# Patient Record
Sex: Male | Born: 1940 | Race: White | Hispanic: No | Marital: Married | Smoking: Never smoker
Health system: Southern US, Community
[De-identification: ages and names within clinical notes are randomized; demographics above are authoritative.]

## PROBLEM LIST (undated history)

## (undated) DIAGNOSIS — I4891 Unspecified atrial fibrillation: Secondary | ICD-10-CM

## (undated) DIAGNOSIS — R413 Other amnesia: Secondary | ICD-10-CM

## (undated) DIAGNOSIS — E7800 Pure hypercholesterolemia, unspecified: Secondary | ICD-10-CM

## (undated) DIAGNOSIS — I10 Essential (primary) hypertension: Secondary | ICD-10-CM

## (undated) HISTORY — PX: HERNIA REPAIR: SHX51

## (undated) HISTORY — PX: JOINT REPLACEMENT: SHX530

---

## 1999-09-17 ENCOUNTER — Ambulatory Visit (HOSPITAL_COMMUNITY): Admission: RE | Admit: 1999-09-17 | Discharge: 1999-09-17 | Payer: Self-pay | Admitting: Cardiology

## 1999-10-15 ENCOUNTER — Ambulatory Visit (HOSPITAL_COMMUNITY): Admission: RE | Admit: 1999-10-15 | Discharge: 1999-10-15 | Payer: Self-pay | Admitting: Cardiology

## 2000-10-28 ENCOUNTER — Ambulatory Visit (HOSPITAL_COMMUNITY): Admission: RE | Admit: 2000-10-28 | Discharge: 2000-10-28 | Payer: Self-pay | Admitting: Cardiology

## 2001-01-25 ENCOUNTER — Encounter: Admission: RE | Admit: 2001-01-25 | Discharge: 2001-01-25 | Payer: Self-pay

## 2001-02-03 ENCOUNTER — Ambulatory Visit (HOSPITAL_COMMUNITY): Admission: RE | Admit: 2001-02-03 | Discharge: 2001-02-03 | Payer: Self-pay | Admitting: Cardiology

## 2001-03-28 ENCOUNTER — Encounter: Admission: RE | Admit: 2001-03-28 | Discharge: 2001-03-28 | Payer: Self-pay

## 2001-05-20 ENCOUNTER — Ambulatory Visit (HOSPITAL_COMMUNITY): Admission: RE | Admit: 2001-05-20 | Discharge: 2001-05-20 | Payer: Self-pay | Admitting: Cardiology

## 2001-08-26 ENCOUNTER — Ambulatory Visit (HOSPITAL_COMMUNITY): Admission: RE | Admit: 2001-08-26 | Discharge: 2001-08-26 | Payer: Self-pay | Admitting: Cardiology

## 2002-04-07 ENCOUNTER — Encounter (INDEPENDENT_AMBULATORY_CARE_PROVIDER_SITE_OTHER): Payer: Self-pay | Admitting: Specialist

## 2002-04-07 ENCOUNTER — Ambulatory Visit (HOSPITAL_COMMUNITY): Admission: RE | Admit: 2002-04-07 | Discharge: 2002-04-07 | Payer: Self-pay | Admitting: Gastroenterology

## 2002-10-13 ENCOUNTER — Encounter: Payer: Self-pay | Admitting: Cardiology

## 2002-10-13 ENCOUNTER — Ambulatory Visit (HOSPITAL_COMMUNITY): Admission: RE | Admit: 2002-10-13 | Discharge: 2002-10-13 | Payer: Self-pay | Admitting: Cardiology

## 2005-05-20 ENCOUNTER — Emergency Department (HOSPITAL_COMMUNITY): Admission: EM | Admit: 2005-05-20 | Discharge: 2005-05-20 | Payer: Self-pay | Admitting: Emergency Medicine

## 2005-06-09 ENCOUNTER — Ambulatory Visit (HOSPITAL_COMMUNITY): Admission: RE | Admit: 2005-06-09 | Discharge: 2005-06-09 | Payer: Self-pay | Admitting: General Surgery

## 2006-08-23 ENCOUNTER — Ambulatory Visit: Payer: Self-pay | Admitting: Vascular Surgery

## 2010-06-10 NOTE — Procedures (Signed)
CAROTID DUPLEX EXAM   INDICATION:  Follow up carotid artery disease based on [facility]  screening last year.   HISTORY:  Diabetes:  No  Cardiac:  Atrial fibrillation  Hypertension:  Yes  Smoking:  Quit  Previous Surgery:  No  CV History:  No  Amaurosis Fugax No, Paresthesias No, Hemiparesis No                                       RIGHT             LEFT  Brachial systolic pressure:         130               136  Brachial Doppler waveforms:         Triphasic         Triphasic  Vertebral direction of flow:        Antegrade         Antegrade  DUPLEX VELOCITIES (cm/sec)  CCA peak systolic                   70                90  ECA peak systolic                   115               91  ICA peak systolic                   62                65  ICA end diastolic                   13                22  PLAQUE MORPHOLOGY:                  Mixed             Mixed  PLAQUE AMOUNT:                      Mild              Mild  PLAQUE LOCATION:                    ICA               ICA   IMPRESSION:  Bilateral minimal internal carotid artery stenosis of 1-  39%.   ___________________________________________  Di Kindle. Edilia Bo, M.D.   AS/MEDQ  D:  08/23/2006  T:  08/24/2006  Job:  [id number]   cc:   Denman Kirk, MD  Anna Genre. Little, M.D.

## 2010-06-13 NOTE — Consult Note (Signed)
[name], [name] NO.:  [id number]   MEDICAL RECORD NO.:  [id number]          PATIENT TYPE:  EMS   LOCATION:  ED                           FACILITY:  [facility]   PHYSICIAN:  Lorne Skeens. Hoxworth, M.D.DATE OF BIRTH:  [birth year]   DATE OF CONSULTATION:  05/20/2005  DATE OF DISCHARGE:                                   CONSULTATION   CHIEF COMPLAINT:  Right groin pain.   HISTORY OF PRESENT ILLNESS:  Mr. [name] is a 70 year old male who is seen  at the request of Dr. Henrine Screws.  The patient was seen by Dr. Abigail Miyamoto  in his office earlier today, and after a discussion on the phone, is seen by  me in the [facility] Emergency Room.  Mr. [name] states that, while  lifting some heavy objects about 2 weeks ago, he developed some pain in his  right groin.  This was not severe and got better over a couple of days.  He  has had some minor intermittent, discomfort since that time, but this  morning, he developed more acute pain in his right inguinal area that was  fairly severe, and he presented Dr. Abigail Miyamoto for evaluation.  He was noted to  be very tender in the right inguinal canal by Dr. Abigail Miyamoto, and there was  concern over an incarcerated hernia, and he is now seen in the [facility]  Emergency Room.  At this point, his pain is significantly better.  He is  feeling somewhat sore, but having no acute pain.  He had no nausea or  vomiting.  No abdominal distension.  He has no previous history of known  inguinal hernias or hernia repairs.   PAST MEDICAL HISTORY:  Surgery:  Significant only for a total knee  replacement of the left.  Medical:  He has a history of atrial fibrillation,  hypertension, elevated cholesterol.   [name], MD, primary physician; Corliss Marcus, MD, cardiologist   CURRENT MEDICATIONS:  Verapamil SR 240 daily,  hydrochlorothiazide/triamterene 50/75 one-half tablet daily, simvastatin 10  mg daily, irbesartan 300 mg daily, amiodarone 200 mg  alternating with 400 mg  daily.   ALLERGIES:  NONE.   SOCIAL HISTORY:  Retired, but works part-time at [employer].  He does not smoke cigarettes or drink alcohol.  He is married.   FAMILY HISTORY:  Noncontributory.   REVIEW OF SYSTEMS:  GENERAL:  No fever, chills, weight change.  RESPIRATORY:  No shortness of breath, cough, wheezing.  CARDIAC:  No recent palpitations.  No history of chest pain, angina, MI.  HEENT:  No vision, hearing,  swallowing problems.  ABDOMEN:  He has some chronic constipation.  No  change.  GU:  He has some chronic urinary hesitancy.  No recent change.  EXTREMITIES:  Some occasional joint pain.  HEMATOLOGIC:  No history of  abnormal bleeding or blood clots.   PHYSICAL EXAM:  VITAL SIGNS:  Afebrile, respirations 16, pulse 54, blood  pressure 134/84.  GENERAL:  In general, a well-developed male in no acute distress.  SKIN:  Warm, dry.  No rash or infection.  HEENT:  No mass or thyromegaly.  Sclerae nonicteric.  LUNGS:  Clear without wheezing or increased work of breathing.  CARDIAC:  Regular rate and rhythm.  No murmurs, no edema.  Peripheral pulses  intact.  ABDOMEN:  Generally, soft and nontender.  Liver edge palpable a couple of  centimeters below the right costal margin.  There is a slightly tender  moderate size, but definitely reducible, right inguinal hernia.  There is a  small, but definite nontender, reducible left inguinal hernia, as well.  GU:  Normal genitalia and testicles.  EXTREMITIES:  Well-healed incision of left knee.  No deformity, joint  swelling.  NEUROLOGIC:  He is alert, oriented.  Motor and sensory exam is grossly  normal.   LABORATORY AND X-RAY DATA:  None today.   ASSESSMENT AND PLAN:  Bilateral inguinal hernias with possible brief episode  of incarceration on the right, but this has now resolved.  I discussed  options for repair with him today, including unilateral, bilateral repair.  He will prefer bilateral  repair, which I think would be his best choice.  We  could plan to do this laparoscopically for a shorter recovery time, and he  is in agreement with this approach.  The nature of procedure, expected  recovery; risks of bleeding, infection, recurrence were discussed and  understood.  The patient will be discharged home today, and we will schedule  for elective laparoscopic bilateral inguinal hernia repair.      Lorne Skeens. Hoxworth, M.D.  Electronically Signed     BTH/MEDQ  D:  05/20/2005  T:  05/21/2005  Job:  [id number]   cc:   Caryn Bee L. Little, M.D.  Fax: [phone]   Francisca December, M.D.  Fax: [phone]   Chales Salmon. Abigail Miyamoto, M.D.  Fax: [phone]

## 2010-06-13 NOTE — Procedures (Signed)
[place]. [facility]  Patient:    KY, RUMPLE Visit Number: [id number] MRN: [id number]          Service Type: END Location: ENDO Attending Physician:  Corliss Marcus Dictated by:   Francisca December, M.D. Proc. Date: 02/03/01 Admit Date:  02/03/2001   CC:         Caryn Bee L. Little, M.D.             Echocardiography Laboratory                           Procedure Report  PROCEDURES: 1. Transesophageal echocardiogram. 2. Elective cardioversion.  INDICATION:  [name] is a 70 year old man with known atrial fibrillation, who has developed atrial flutter despite amiodarone therapy. This was increased in the past two weeks.  He has been anticoagulated for approximately two weeks.  He has symptomatic atrial flutter with decrease in energy level and increase in dyspnea.  He is brought now for a clearing TEE to rule out left atrial thrombus and subsequent cardioversion if able.  DESCRIPTION OF PROCEDURE:  Transesophageal echocardiography was performed following posterior pharyngeal anesthesia with 20% Benzocaine.  The patient was sedated with 7.5 mg of Versed and 50 mcg of fentanyl.  The probe was introduced atraumatically.  Excellent transesophageal images were obtained. Heart rate, O2 saturation, ECG, and blood pressure were monitored throughout, and he remained entirely stable.  FINDINGS:  Left ventricular size and systolic function were normal.  There was no evidence for LVH; however, a good transgastric short axis of the LV was not obtained.  There was no LVH by appearance from the four-chamber view.  Right ventricle and right atrium were not enlarged.  The tricuspid valve was morphologically normal.  There was no significant TR.  Mitral and aortic valves were morphologically normal.  There was no significant AI.  There was trace MR.  The left atrium was mild to moderately enlarged.  There was good visualization of the entire left atrium and the  left atrial appendage with absence of any apparent thrombus.  The left atrial appendage ejection velocity using spectral pulse width Doppler was 60 cm/sec.  There was no flow reversal seen in the pulmonary vein.  The interatrial septum was intact by both 2D and color flow Doppler analysis.  Finally, the aorta was not enlarged, no evidence of significant atherosclerosis.  PROCEDURE:  Elective cardioversion.  DESCRIPTION OF PROCEDURE:  Adequate general anesthesia was administered intravenously by Dr. Sheldon Silvan.  A total of 350 mg of IV pentothal administered.  Following establishment of adequate anesthesia and while monitoring heart rate, blood pressure, and O2 saturation, the patient received an initial transthoracic dose of 100 joules DC energy in a synchronized fashion.  This resulted in persistent atrial flutter.  The patient underwent a second cardioversion, 300 joules synchronized, with successful conversion to sinus rhythm.  FINAL IMPRESSION: 1. Moderate left atrial enlargement. 2. No evidence of left atrial thrombus. 3. Intact left ventricular size and systolic function. 4. Trace to mild mitral regurgitation. 5. Status post successful elective cardioversion, atrial flutter to sinus    rhythm.  PLAN:  The patient will continue on 400 mg of amiodarone daily until seen in the office in two weeks.  Will continue systemic anticoagulation for a total of four weeks from today. Dictated by:   Francisca December, M.D. Attending Physician:  Corliss Marcus DD:  02/03/01 TD:  02/04/01 Job: [id number]

## 2010-06-13 NOTE — Op Note (Signed)
   NAMESHERMON, BOZZI                         ACCOUNT NO.:  [id number]   MEDICAL RECORD NO.:  [id number]                   PATIENT TYPE:  OIB   LOCATION:  2899                                 FACILITY:  [facility]   PHYSICIAN:  Armanda Magic, M.D.                  DATE OF BIRTH:  01/25/1941   DATE OF PROCEDURE:  10/13/2002  DATE OF DISCHARGE:                                 OPERATIVE REPORT   REFERRING PHYSICIAN:  Francisca December, M.D.  Anna Genre Little, M.D.   PROCEDURE:  Direct current cardioversion.   OPERATOR:  Armanda Magic, M.D.   INDICATIONS FOR PROCEDURE:  Atrial fibrillation which is recurrent status  post increase in amiodarone dose.   COMPLICATIONS:  None.   IV MEDICATIONS:  Pentothal 300 mg.   This is a very pleasant 70 year old white male with a history of recurrent  atrial fibrillation.  This is his fourth presentation for DC cardioversion.  He recently had his amiodarone dose increased and a load performed.  He now  presents for cardioversion.  The patient is brought to the day hospital in a  fasting, nonsedated state.  Informed consent was obtained.  The patient was  connected to continuous heart rate and pulse oximetry monitoring,  intermittent blood pressure monitoring.  The patient was sedated with 300 mg  IV Pentothal.  After adequate anesthesia was obtained, a 150 joule  synchronized biphasic extrathoracic shock was delivered successfully  converting the patient to normal sinus rhythm and then sinus bradycardia.  The patient tolerated the procedure well.    ASSESSMENT:  1. Recurrent atrial fibrillation status post repeat amiodarone load.  2. Successful DC cardioversion to normal sinus rhythm/sinus bradycardia.   PLAN:  Discharge to home after fully awake and alert.  Follow up with Dr.  Amil Amen in 2-3 weeks.                                               Armanda Magic, M.D.    TT/MEDQ  D:  10/13/2002  T:  10/13/2002  Job:  [id number]   cc:   Francisca December, M.D.  [address]  [address]  [place]  [place] [address]  Fax: [phone]   Anna Genre. Little, M.D.  [address]  [place]  [place] [address]  Fax: [phone]

## 2010-06-13 NOTE — Procedures (Signed)
[place]. [facility]  Patient:    [name], [name]                      MRN: [id number] Proc. Date: 09/17/99 Adm. Date:  [id number] Disc. Date: [id number] Attending:  Corliss Marcus CC:         Anna Genre. Little, M.D.   Procedure Report  PROCEDURE PERFORMED:  Elective cardioversion.  INDICATIONS:  [name] is a 70 year old man with apparent lone atrial fibrillation discovered on recent routine physical examination.  He has always had controlled ventricular response.  He has been anticoagulated for approximately five weeks now with p.o. warfarin, and three days ago was initiated on p.o. Rythmol 150 mg p.o. t.i.d.  He has undergone an exercise Cardiolite which has been found to be normal without evidence of ischemia, as well an echocardiogram showed no structural heart disease.  He is for elective cardioversion at this time.  PROCEDURE NOTE:  Following IV general anesthesia administered by Dr. Katrinka Blazing 375 of Pentothal, and while monitoring heart rate, blood pressure, O2 saturation, and ECG, the patient was delivered a single dose of 300 joules transthoracic energy in a biphasic pattern manner.  There was prompt return of sinus rhythm. At this time the patient is awakening and without apparent complication.  IMPRESSION:  Successful elective cardioversion to sinus rhythm.  PLAN: 1. Will continue warfarin x 3 weeks at the current dose.  INR today was 2.68. 2. Continue Rythmol 150 mg p.o. t.i.d. 3. Office visit in two to three weeks. DD:  09/17/99 TD:  09/18/99 Job: [id number]

## 2010-06-13 NOTE — Op Note (Signed)
NAMEPINK, MAYE             ACCOUNT NO.:  [id number]   MEDICAL RECORD NO.:  [id number]          PATIENT TYPE:  AMB   LOCATION:  DAY                          FACILITY:  [facility]   PHYSICIAN:  Sharlet Salina T. Hoxworth, M.D.DATE OF BIRTH:  March 02, 1940   DATE OF PROCEDURE:  06/09/2005  DATE OF DISCHARGE:                                 OPERATIVE REPORT   PREOPERATIVE DIAGNOSIS:  Bilateral inguinal hernias.   POSTOPERATIVE DIAGNOSIS:  Bilateral inguinal hernias.   SURGICAL PROCEDURES:  Laparoscopic repair of bilateral inguinal hernias.   SURGEON:  Dr. Johna Sheriff.   ANESTHESIA:  General.   BRIEF HISTORY:  Mr. Shrout is a 70 year old male who recently presented  with acute right groin pain and on examination was found to have a moderate-  sized reducible right inguinal hernia that had probably become briefly  incarcerated.  Also noted was a smaller left inguinal hernia that had been  asymptomatic.  After discussion of options, we have elected to proceed with  laparoscopic bilateral repair.  Ashby Dawes of the procedures, indications, risks  of bleeding, infection, recurrence and possible need for open procedure were  discussed understood.  He is now brought to the operating room for this  procedure.   DESCRIPTION OF PROCEDURE:  The patient was brought to the operating room and  placed in supine position on the operating table, and general orotracheal  anesthesia was induced.  Preoperative antibiotics were given intravenously.  The abdomen and groins were widely and sterilely prepped and draped.  Correct patient and procedure were verified.  Local anesthesia was used to  infiltrate the trocar sites.  I made a 1-cm curvilinear incision just  beneath the umbilicus.  Dissection was carried down to the midline fascia  and anterior rectus sheath just to the right of midline.  The rectus sheath  was incised transversely for about a centimeter, and the medial border of  the rectus muscle retracted  laterally and the preperitoneal space entered.  The balloon dissector was then passed into its space with its tip to the  pubic symphysis and inflated with good bilateral deployment of the balloon.  This was left in place for two or three minutes for hemostasis, then  deflated and removed, and the structural balloon trocar inflated and  secured.  CO2 pressure was applied.  The laparoscopy showed reasonably good  bilateral dissection but still some adherent peritoneum laterally.  The  right side was approached first.  Pubic symphysis was identified, and the  Cooper's ligaments cleared down to the iliac vessels which were carefully  identified.  The direct space was examined, and was no direct hernia.  The  epigastric vessels were clearly seen.  The peritoneum was identified  laterally and bluntly taken down off the anterior abdominal wall, out toward  the anterior-superior iliac spine.  Peritoneum was then traced back medially  and was seen to go up into a good-sized indirect sac along the cord  structures.  The indirect sac was then carefully dissected away from cord  structures using careful blunt dissection until it was completely and taken  the internal ring and stripped  back from cord structures well posteriorly.  Following this, an identical dissection was performed on the left side with  the same findings, although the hernia sac was smaller.  A small opening was  made in this hernia sac, but the pneumoperitoneum was not any difficulty.  Following complete dissection of both sides, initially on the left side a  large piece of Bard 3-D max mesh was placed, oriented, tacked initially to  the pubic symphysis and along Cooper's ligament down to avoiding the iliac  vessels.  Mesh was deployed out well laterally and then tacked laterally,  each tacked being able to be felt through the anterior abdominal wall,  working back along the superior and medial edges until the mesh was well   secured.  This provided nice broad coverage the direct and indirect spaces.  A right-sided large piece of Bard mesh was then placed in identical fashion  on the right.  On this side large, a hernia sac was tacked up to the mesh  posterior to it.  The operative site inspected for hemostasis which appeared  complete.  Trocars were removed and all CO2 evacuated.  Following this, a  Veress needle was used right upper quadrant to evacuate the pneumoperitoneum  without difficulty.  The fascial defect just beneath the umbilicus was  closed with a figure-of-eight sutures of 0 Vicryl.  The patient did have a  tiny umbilical hernia, and the umbilical skin was dissected up off this,  defining fascial edges which were then closed with a single figure-of-eight  suture of 0 Surgilon.  The skin was tacked down to the fascial level.  Skin  was closed with interrupted subcuticular 4-0 Monocryl and Steri-Strips.  Sponge, needle, and instrument counts correct. Dressings were applied.  The  patient taken to recovery in good condition.      Lorne Skeens. Hoxworth, M.D.  Electronically Signed     BTH/MEDQ  D:  06/09/2005  T:  06/09/2005  Job:  [id number]

## 2010-06-13 NOTE — Cardiovascular Report (Signed)
[place]. [facility]  Patient:    [name], [name]                      MRN: [id number] Proc. Date: 10/15/99 Adm. Date:  [id number] Disc. Date: [id number] Attending:  Corliss Marcus                        Cardiac Catheterization  PROCEDURE PERFORMED:  Electrical cardioversion.  INDICATIONS FOR PROCEDURE:  [name] is a 70 year old male with atrial fibrillation.  He had been previously anticoagulated and started on Rhythmol 150 mg p.o. t.i.d.  He underwent electrical cardioversion while on ______, returned to sinus rhythm but subsequently developed atrial fibrillation.  The patient was started on amiodarone per Dr. Amil Amen.  He has not been anticoagulated and plans for electrical cardioversion.  DESCRIPTION OF PROCEDURE:  Following IV general anesthesia, the patient received 200 mg of propofol per Dr. Gelene Mink.  While monitoring the heart rate blood pressure and O2 saturation the patient was delivered a single dose of 360 joules of transthoracic energy and in a biphasic pattern.  There was prompt return of sinus rhythm to bradycardia.  IMPRESSION:  Successful electrical cardioversion to sinus bradycardia with a rate of 48-50.  PLAN:  Continue amiodarone per Dr. Amil Amen.  Followup in the office in two to three weeks.  Maintain INR 2-3 for an additional three weeks. DD:  10/15/99 TD:  10/15/99 Job: [id number]

## 2010-08-12 ENCOUNTER — Other Ambulatory Visit: Payer: Self-pay | Admitting: Physician Assistant

## 2010-08-12 DIAGNOSIS — R0989 Other specified symptoms and signs involving the circulatory and respiratory systems: Secondary | ICD-10-CM

## 2010-08-12 DIAGNOSIS — R198 Other specified symptoms and signs involving the digestive system and abdomen: Secondary | ICD-10-CM

## 2010-08-14 ENCOUNTER — Ambulatory Visit
Admission: RE | Admit: 2010-08-14 | Discharge: 2010-08-14 | Disposition: A | Discharge status: Home / Self Care | Payer: Medicare Other | Source: Ambulatory Visit | Attending: Physician Assistant | Admitting: Physician Assistant

## 2010-08-14 DIAGNOSIS — R0989 Other specified symptoms and signs involving the circulatory and respiratory systems: Secondary | ICD-10-CM

## 2010-08-14 DIAGNOSIS — R198 Other specified symptoms and signs involving the digestive system and abdomen: Secondary | ICD-10-CM

## 2013-08-23 ENCOUNTER — Emergency Department (HOSPITAL_COMMUNITY)
Admission: EM | Admit: 2013-08-23 | Discharge: 2013-08-23 | Disposition: A | Discharge status: Home / Self Care | Payer: Worker's Compensation | Attending: Emergency Medicine | Admitting: Emergency Medicine

## 2013-08-23 ENCOUNTER — Encounter (HOSPITAL_COMMUNITY): Payer: Self-pay | Admitting: Emergency Medicine

## 2013-08-23 ENCOUNTER — Emergency Department (HOSPITAL_COMMUNITY): Payer: Worker's Compensation

## 2013-08-23 DIAGNOSIS — I4891 Unspecified atrial fibrillation: Secondary | ICD-10-CM | POA: Insufficient documentation

## 2013-08-23 DIAGNOSIS — S62009A Unspecified fracture of navicular [scaphoid] bone of unspecified wrist, initial encounter for closed fracture: Secondary | ICD-10-CM | POA: Insufficient documentation

## 2013-08-23 DIAGNOSIS — S62002S Unspecified fracture of navicular [scaphoid] bone of left wrist, sequela: Secondary | ICD-10-CM

## 2013-08-23 DIAGNOSIS — Z79899 Other long term (current) drug therapy: Secondary | ICD-10-CM | POA: Insufficient documentation

## 2013-08-23 DIAGNOSIS — Z7901 Long term (current) use of anticoagulants: Secondary | ICD-10-CM | POA: Insufficient documentation

## 2013-08-23 DIAGNOSIS — I10 Essential (primary) hypertension: Secondary | ICD-10-CM | POA: Insufficient documentation

## 2013-08-23 DIAGNOSIS — Y92410 Unspecified street and highway as the place of occurrence of the external cause: Secondary | ICD-10-CM | POA: Insufficient documentation

## 2013-08-23 DIAGNOSIS — S4980XA Other specified injuries of shoulder and upper arm, unspecified arm, initial encounter: Secondary | ICD-10-CM | POA: Insufficient documentation

## 2013-08-23 DIAGNOSIS — S46909A Unspecified injury of unspecified muscle, fascia and tendon at shoulder and upper arm level, unspecified arm, initial encounter: Secondary | ICD-10-CM | POA: Insufficient documentation

## 2013-08-23 DIAGNOSIS — E7800 Pure hypercholesterolemia, unspecified: Secondary | ICD-10-CM | POA: Insufficient documentation

## 2013-08-23 DIAGNOSIS — Y9389 Activity, other specified: Secondary | ICD-10-CM | POA: Insufficient documentation

## 2013-08-23 DIAGNOSIS — M25512 Pain in left shoulder: Secondary | ICD-10-CM

## 2013-08-23 HISTORY — DX: Unspecified atrial fibrillation: I48.91

## 2013-08-23 HISTORY — DX: Essential (primary) hypertension: I10

## 2013-08-23 HISTORY — DX: Pure hypercholesterolemia, unspecified: E78.00

## 2013-08-23 MED ORDER — DIAZEPAM 5 MG PO TABS: 5.0000 mg | ORAL_TABLET | Freq: Four times a day (QID) | ORAL | Status: AC | PRN

## 2013-08-23 MED ORDER — HYDROCODONE-ACETAMINOPHEN 5-325 MG PO TABS: 1.0000 | ORAL_TABLET | Freq: Four times a day (QID) | ORAL | Status: AC | PRN

## 2013-08-23 MED ORDER — HYDROCODONE-ACETAMINOPHEN 5-325 MG PO TABS
2.0000 | ORAL_TABLET | Freq: Once | ORAL | Status: AC
  Administered 2013-08-23: 2 via ORAL
  Filled 2013-08-23: qty 2
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
## 2013-08-23 NOTE — Progress Notes (Signed)
Orthopedic Tech Progress Note Patient Details:  Orville GovernGeorge B Necaise [birth year] [id number] [employer] Type of Ortho Device: Thumb velcro splint Ortho Device/Splint Location: LUE Ortho Device/Splint Interventions: Application   Asia R Thompson 08/23/2013, 11:23 AM

## 2013-08-23 NOTE — Discharge Instructions (Signed)

## 2018-05-27 HISTORY — PX: KNEE SURGERY: SHX244

## 2018-10-09 ENCOUNTER — Other Ambulatory Visit: Payer: Self-pay

## 2018-10-09 ENCOUNTER — Emergency Department (INDEPENDENT_AMBULATORY_CARE_PROVIDER_SITE_OTHER)
Admission: EM | Admit: 2018-10-09 | Discharge: 2018-10-09 | Disposition: A | Discharge status: Home / Self Care | Payer: Medicare Other | Source: Home / Self Care | Attending: Family Medicine | Admitting: Family Medicine

## 2018-10-09 ENCOUNTER — Encounter: Payer: Self-pay | Admitting: Emergency Medicine

## 2018-10-09 ENCOUNTER — Emergency Department (INDEPENDENT_AMBULATORY_CARE_PROVIDER_SITE_OTHER): Payer: Medicare Other

## 2018-10-09 DIAGNOSIS — M25511 Pain in right shoulder: Secondary | ICD-10-CM

## 2018-10-09 DIAGNOSIS — S53401A Unspecified sprain of right elbow, initial encounter: Secondary | ICD-10-CM

## 2018-10-09 DIAGNOSIS — S43401A Unspecified sprain of right shoulder joint, initial encounter: Secondary | ICD-10-CM | POA: Diagnosis not present

## 2018-10-09 HISTORY — DX: Other amnesia: R41.3

## 2018-10-09 MED ORDER — OXYCODONE HCL 5 MG PO TABS: 5.0000 mg | ORAL_TABLET | Freq: Two times a day (BID) | ORAL | 0 refills | Status: AC | PRN

## 2018-10-09 NOTE — Discharge Instructions (Addendum)
Apply ice pack for 20 to 30 minutes, 3 to 4 times daily  Continue until pain and swelling decrease.  May take Tylenol daytime as needed for pain.  If symptoms become significantly worse during the night or over the weekend, proceed to the local emergency room.
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
## 2018-10-09 NOTE — ED Triage Notes (Signed)
Patient here with wife; he is walking with cane; she is helping with history/triage due to some memory problems. He fell 2 days ago on flat surface; landed with resulting pain right shoulder and arm; today he fell while en route to [facility] and does not have any additional pain. He is on Eliquis. He had knee surgery May 2020. He took oxycodone and tylenol at around 0800 today.  He has not travelled past 4 weeks.

## 2020-03-14 IMAGING — DX DG ELBOW COMPLETE 3+V*R*
4 series · 4 of 4 positions shown · non-contrast
Comparison: None.

CLINICAL DATA: Acute RIGHT shoulder pain following fall 2 days ago.
Initial encounter.

EXAM:
RIGHT ELBOW - COMPLETE 3+ VIEW

[elbow ap]
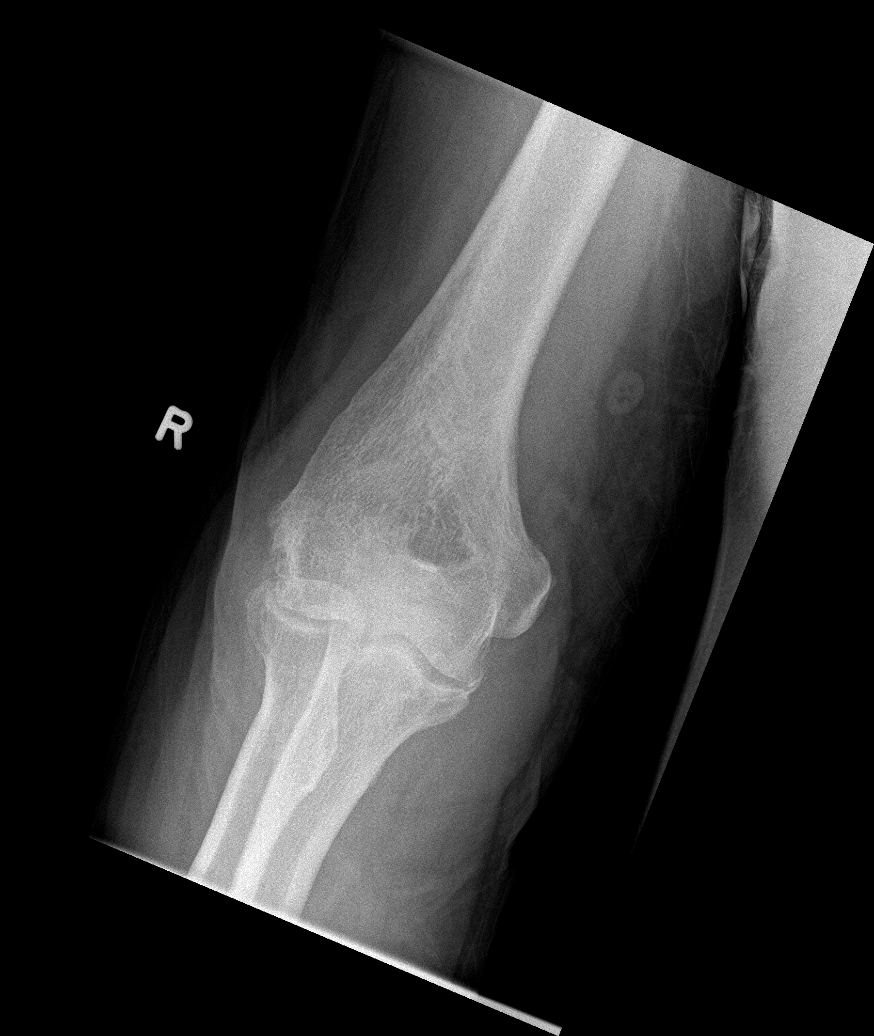

[elbow obl (1 of 2)]
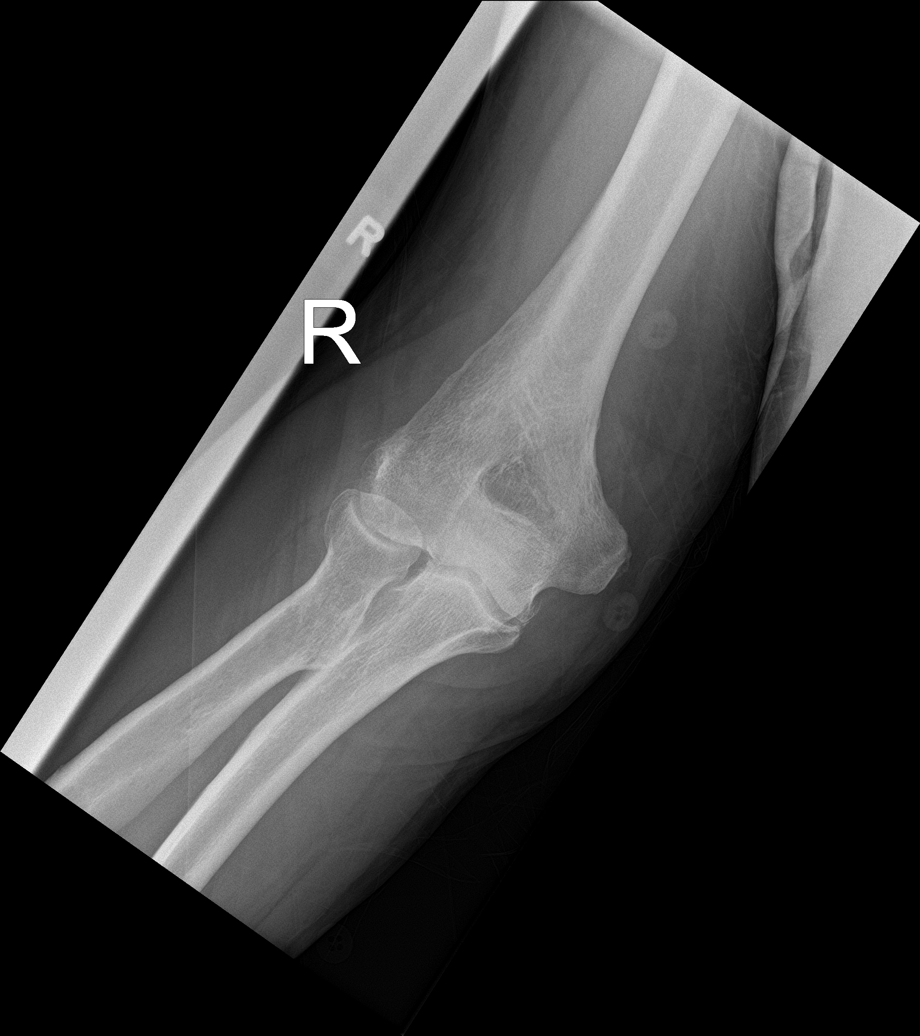

[elbow obl (2 of 2)]
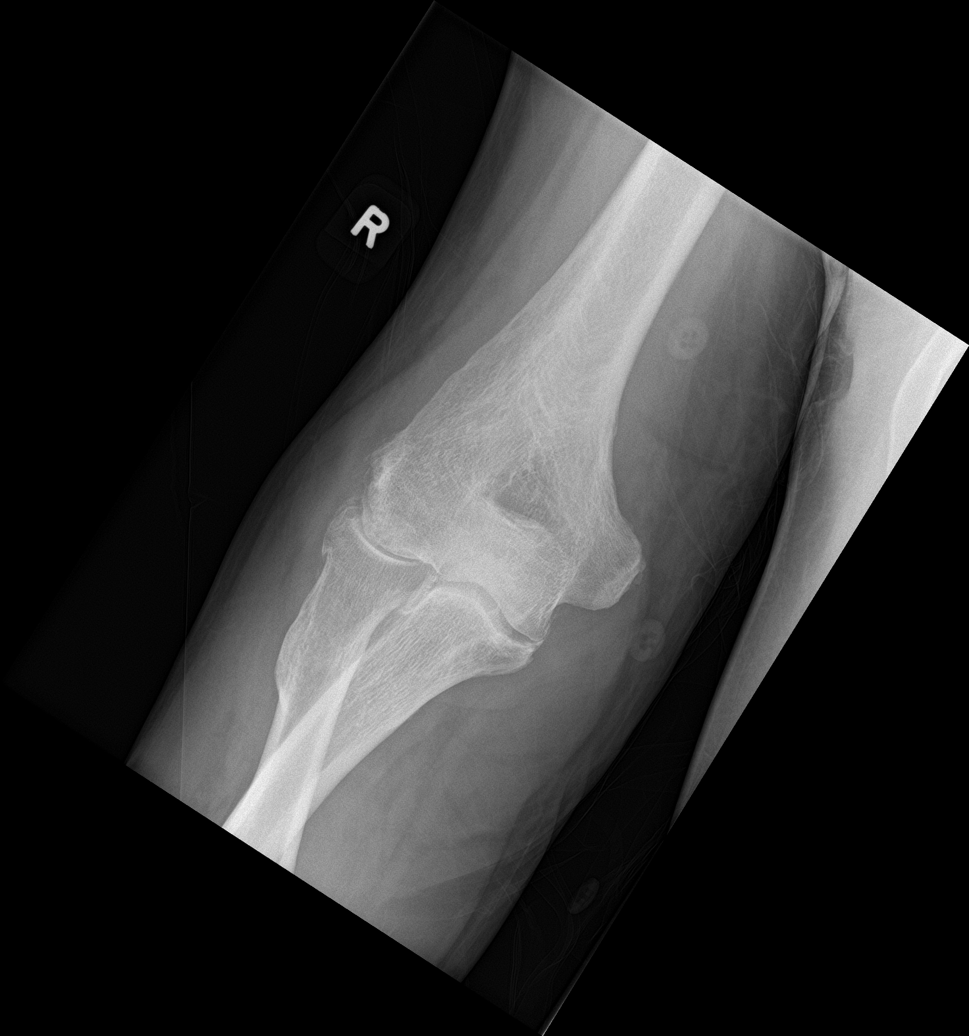

[elbow lat]
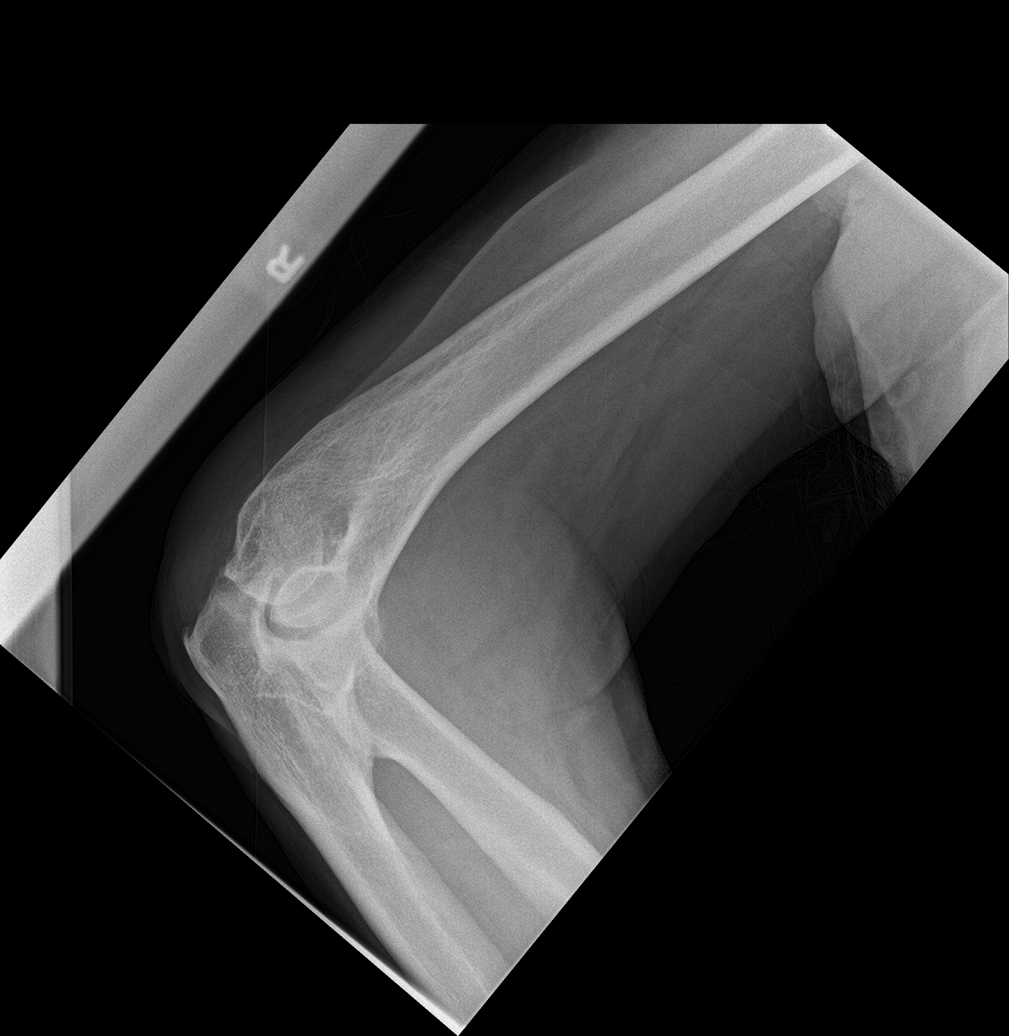

[4 of 4 positions shown; findings below may reference images not displayed]

FINDINGS: Degenerative changes within the elbow noted.

No definite fracture noted.

No dislocation.

No joint effusion noted.
IMPRESSION: No definite acute bony abnormality.  Degenerative changes.

## 2022-03-25 ENCOUNTER — Ambulatory Visit
Admission: EM | Admit: 2022-03-25 | Discharge: 2022-03-25 | Disposition: A | Discharge status: Home / Self Care | Payer: Medicare Other

## 2022-03-25 ENCOUNTER — Encounter: Payer: Self-pay | Admitting: Emergency Medicine

## 2022-03-25 ENCOUNTER — Other Ambulatory Visit: Payer: Self-pay

## 2022-03-25 DIAGNOSIS — R197 Diarrhea, unspecified: Secondary | ICD-10-CM

## 2022-03-25 NOTE — ED Triage Notes (Signed)
Pt arrive with wife and care giver c/o uncontrolled diarrhea since last Friday starting having bright red blood on the stool.

## 2022-03-25 NOTE — Discharge Instructions (Addendum)
Instructed Wife/caregiver/patient to go to [place] emergency department now for further evaluation of bloody diarrhea/rectal bleeding.

## 2022-03-25 NOTE — ED Provider Notes (Signed)
[name]    CSN: [id number] Arrival date & time: 03/25/22  1459      History   Chief Complaint Chief Complaint  Patient presents with   Diarrhea    HPI [name] is a 82 y.o. male.   HPI Pleasant 83 year old male presents with uncontrolled diarrhea per wife who is accompanying patient since Friday, 03/20/2022.  Patient's wife reports that diarrhea became bloody earlier this morning.  Additionally, wife reports patient was evaluated by his internal medicine provider at the [facility] health care center in [place] and had recent CT of abdomen and pelvis for unexplained weight loss.  Wife reports that this recent imaging was normal per [facility] physician with [facility]. Patient/wife are also accompanied by patient's caregiver this afternoon who concurs with regard to this morning's rectal bleeding, as he has been cleaning up diarrhea for patient over the past 4-5 days.  PMH significant for atrial fibrillation with pacemaker in place (patient is currently on apixaban and diclofenac), hypertension, and memory change.  Patient is oriented x 1 notable pallor today at time of exam.  Past Medical History:  Diagnosis Date   Atrial fibrillation ([place])    High cholesterol    Hypertension    Memory change     There are no problems to display for this patient.   Past Surgical History:  Procedure Laterality Date   HERNIA REPAIR     JOINT REPLACEMENT     KNEE SURGERY Bilateral 05/2018   five surgeries       Home Medications    Prior to Admission medications   Medication Sig Start Date End Date Taking? Authorizing Provider  apixaban (ELIQUIS) 2.5 MG TABS tablet Take 5 mg by mouth 2 (two) times daily.    [provider]  dabigatran (PRADAXA) 150 MG CAPS capsule Take 150 mg by mouth 2 (two) times daily.    [provider]  diazepam (VALIUM) 5 MG tablet Take 1 tablet (5 mg total) by mouth every 6 (six) hours as needed for anxiety (spasms). 08/23/13   Evelina Bucy, MD   diclofenac (VOLTAREN) 75 MG EC tablet Take 75 mg by mouth daily as needed for mild pain.    [provider]  donepezil (ARICEPT) 5 MG tablet Take 5 mg by mouth at bedtime.    [provider]  glucosamine-chondroitin 500-400 MG tablet Take 1 tablet by mouth 2 (two) times daily.    [provider]  HYDROcodone-acetaminophen (NORCO/VICODIN) 5-325 MG per tablet Take 1 tablet by mouth every 6 (six) hours as needed for moderate pain. 08/23/13   Evelina Bucy, MD  lisinopril (PRINIVIL,ZESTRIL) 40 MG tablet Take 40 mg by mouth daily.    [provider]  losartan (COZAAR) 100 MG tablet Take 100 mg by mouth daily.    [provider]  memantine (NAMENDA) 10 MG tablet Take 10 mg by mouth 2 (two) times daily.    [provider]  Multiple Vitamins-Minerals (MENS MULTIVITAMIN PLUS PO) Take 1 tablet by mouth daily.    [provider]  omeprazole (PRILOSEC) 20 MG capsule Take 20 mg by mouth 2 (two) times daily.    [provider]  OVER THE COUNTER MEDICATION Take 1 tablet by mouth at bedtime. "Somnapure"    [provider]  polyethylene glycol (MIRALAX / GLYCOLAX) packet Take 17 g by mouth daily.    [provider]  pravastatin (PRAVACHOL) 80 MG tablet Take 80 mg by mouth daily.    [provider]  testosterone cypionate (DEPOTESTOTERONE CYPIONATE) 200 MG/ML injection Inject 200 mg into the muscle every 21 ( twenty-one) days.    [provider]  triamterene-hydrochlorothiazide (MAXZIDE) 75-50 MG per tablet Take 0.5 tablets by mouth daily.    [provider]    Family History History reviewed. No pertinent family history.  Social History Social History   Tobacco Use   Smoking status: Never   Smokeless tobacco: Never  Substance Use Topics   Alcohol use: No   Drug use: No     Allergies   Patient has no known allergies.   Review of Systems Review of Systems  Gastrointestinal:   Positive for blood in stool and diarrhea.  Skin:  Positive for pallor.  All other systems reviewed and are negative.    Physical Exam Triage Vital Signs ED Triage Vitals  Enc Vitals Group     BP      Pulse      Resp      Temp      Temp src      SpO2      Weight      Height      Head Circumference      Peak Flow      Pain Score      Pain Loc      Pain Edu?      Excl. in [place]?    No data found.  Updated Vital Signs BP 104/66 (BP Location: Right Arm)   Pulse 60   Temp (!) 97.2 F (36.2 C) (Oral)   Resp 18   Ht '6\' 6"'$  (1.981 m)   Wt 203 lb 0.7 oz (92.1 kg)   SpO2 96%   BMI 23.46 kg/m      Physical Exam Vitals and nursing note reviewed.  Constitutional:      General: He is not in acute distress.    Appearance: Normal appearance. He is normal weight. He is ill-appearing. He is not toxic-appearing or diaphoretic.  HENT:     Head: Normocephalic and atraumatic.     Right Ear: Tympanic membrane, ear canal and external ear normal.     Left Ear: Tympanic membrane, ear canal and external ear normal.     Mouth/Throat:     Mouth: Mucous membranes are moist.     Pharynx: Oropharynx is clear. No posterior oropharyngeal erythema.  Eyes:     Extraocular Movements: Extraocular movements intact.     Conjunctiva/sclera: Conjunctivae normal.     Pupils: Pupils are equal, round, and reactive to light.  Cardiovascular:     Rate and Rhythm: Normal rate and regular rhythm.     Pulses: Normal pulses.     Heart sounds: Normal heart sounds.  Pulmonary:     Effort: Pulmonary effort is normal.     Breath sounds: Normal breath sounds. No wheezing, rhonchi or rales.  Musculoskeletal:        General: Normal range of motion.     Cervical back: Normal range of motion and neck supple. No tenderness.  Lymphadenopathy:     Cervical: No cervical adenopathy.  Skin:    General: Skin is warm and dry.     Coloration: Skin is pale.  Neurological:     General: No focal deficit present.      Mental Status: He is alert. Mental status is at baseline. He is disoriented.     Comments: Patient seated comfortably in wheelchair during exam today  Psychiatric:  Mood and Affect: Mood normal.        Behavior: Behavior normal.      UC Treatments / Results  Labs (all labs ordered are listed, but only abnormal results are displayed) Labs Reviewed - No data to display  EKG   Radiology No results found.  Procedures Procedures (including critical care time)  Medications Ordered in UC Medications - No data to display  Initial Impression / Assessment and Plan / UC Course  I have reviewed the triage vital signs and the nursing notes.  Pertinent labs & imaging results that were available during my care of the patient were reviewed by me and considered in my medical decision making (see chart for details).     MDM: 1.  Diarrhea, unspecified; 2.  Bloody diarrhea-8 advised wife/caregiver/patient to go to [facility] ED now for further evaluation of diarrhea/rectal bleeding (probably to include IV fluid, stool kit, labs/blood culture, determine source of bloody diarrhea.  Wife agreed and verbalized understanding of these instructions and this plan of care today.  Patient discharged, hemodynamically stable.   Final Clinical Impressions(s) / UC Diagnoses   Final diagnoses:  Diarrhea, unspecified type  Bloody diarrhea     Discharge Instructions      Instructed Wife/caregiver/patient to go to [facility] emergency department now for further evaluation of bloody diarrhea/rectal bleeding.     ED Prescriptions   None    PDMP not reviewed this encounter.   Eliezer Lofts, [place] 03/25/22 [phone]

## 2022-03-26 ENCOUNTER — Telehealth: Payer: Self-pay

## 2022-10-27 DEATH — deceased
